# Patient Record
Sex: Female | Born: 1935 | Race: White | Hispanic: No | Smoking: Former smoker
Health system: Southern US, Community
[De-identification: ages and names within clinical notes are randomized; demographics above are authoritative.]

## PROBLEM LIST (undated history)

## (undated) DIAGNOSIS — R2240 Localized swelling, mass and lump, unspecified lower limb: Secondary | ICD-10-CM

## (undated) DIAGNOSIS — E039 Hypothyroidism, unspecified: Secondary | ICD-10-CM

## (undated) DIAGNOSIS — C801 Malignant (primary) neoplasm, unspecified: Secondary | ICD-10-CM

## (undated) DIAGNOSIS — M810 Age-related osteoporosis without current pathological fracture: Secondary | ICD-10-CM

## (undated) HISTORY — PX: EYE SURGERY: SHX253

## (undated) HISTORY — PX: OTHER SURGICAL HISTORY: SHX169

## (undated) HISTORY — DX: Age-related osteoporosis without current pathological fracture: M81.0

## (undated) HISTORY — PX: TONSILLECTOMY: SUR1361

## (undated) HISTORY — DX: Malignant (primary) neoplasm, unspecified: C80.1

---

## 2005-02-16 DIAGNOSIS — C801 Malignant (primary) neoplasm, unspecified: Secondary | ICD-10-CM

## 2005-02-16 HISTORY — PX: BREAST SURGERY: SHX581

## 2005-02-16 HISTORY — PX: PORT A CATH REVISION: SHX6033

## 2005-02-16 HISTORY — DX: Malignant (primary) neoplasm, unspecified: C80.1

## 2009-02-16 HISTORY — PX: TOTAL HIP ARTHROPLASTY: SHX124

## 2012-09-28 ENCOUNTER — Other Ambulatory Visit: Payer: Self-pay | Admitting: Internal Medicine

## 2012-10-14 ENCOUNTER — Ambulatory Visit
Admission: RE | Admit: 2012-10-14 | Discharge: 2012-10-14 | Disposition: A | Discharge status: Home / Self Care | Payer: Medicare Other | Source: Ambulatory Visit | Attending: Internal Medicine | Admitting: Internal Medicine

## 2013-01-11 ENCOUNTER — Encounter (INDEPENDENT_AMBULATORY_CARE_PROVIDER_SITE_OTHER): Payer: Self-pay | Admitting: Surgery

## 2013-01-11 ENCOUNTER — Encounter (INDEPENDENT_AMBULATORY_CARE_PROVIDER_SITE_OTHER): Payer: Self-pay

## 2013-01-11 ENCOUNTER — Ambulatory Visit (INDEPENDENT_AMBULATORY_CARE_PROVIDER_SITE_OTHER): Payer: Medicare Other | Admitting: Surgery

## 2013-01-11 VITALS — BP 142/78 | HR 86 | Resp 16 | Ht 61.5 in | Wt 114.4 lb

## 2013-01-11 DIAGNOSIS — D449 Neoplasm of uncertain behavior of unspecified endocrine gland: Secondary | ICD-10-CM

## 2013-01-11 DIAGNOSIS — D440 Neoplasm of uncertain behavior of thyroid gland: Secondary | ICD-10-CM

## 2013-01-11 DIAGNOSIS — E065 Other chronic thyroiditis: Secondary | ICD-10-CM | POA: Insufficient documentation

## 2013-01-11 NOTE — Progress Notes (Signed)
General Surgery [facility]  Chief Complaint  Patient presents with  . New Evaluation    Hurthle cell neoplasm of thyroid - referral from Dr. Allena Napoleon    HISTORY: Patient is an 79 year old female referred by her primary care physician for evaluation of newly diagnosed thyroid nodule with cytologic atypia. Patient had undergone a carotid duplex scan in August 2014. An incidental finding of right thyroid nodule was made. Patient subsequently underwent a ultrasound guided fine needle aspiration biopsy. 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  The nodule measured 1.6 cm in size. Surgical consultation is now recommended.  Patient has no prior history of thyroid disease. She was recently diagnosed with thyroiditis and hypothyroidism. She has been started on a thyroid hormone supplement. There is family history of thyroid disease in multiple family members. The patient's daughter does take thyroid hormone. The patient has a sister who underwent thyroidectomy. There is no family history of thyroid malignancy. There is no family history of other endocrine neoplasms.  Patient has had no prior head or neck surgery with the exception of tonsillectomy.  Past Medical History  Diagnosis Date  . Osteoporosis   . Cancer     Current Outpatient Prescriptions  Medication Sig Dispense Refill  . Sodium Borate (BORAX) GRAN by Does not apply route. Patient takes 1 tsp of diluted borax a day       No current facility-administered medications for this visit.    No Known Allergies  Family History  Problem Relation Age of Onset  . Cancer Daughter     breast  . Cancer Maternal Aunt     breast    History   Social History  . Marital Status: Divorced    Spouse Name: N/A    Number of Children: N/A  . Years of Education: N/A   Social History Main Topics  . Smoking status: Never Smoker   . Smokeless tobacco: Never Used  . Alcohol  Use: No  . Drug Use: No  . Sexual Activity: None   Other Topics Concern  . None   Social History Narrative  . None    REVIEW OF SYSTEMS - PERTINENT POSITIVES ONLY: Denies tremor. Denies palpitations. Denies compressive symptoms.  EXAM: Filed Vitals:   01/11/13 1006  BP: 142/78  Pulse: 86  Resp: 16    HEENT: normocephalic; pupils equal and reactive; sclerae clear; dentition good; mucous membranes moist NECK:  No palpable dominant nodule within the thyroid bed; symmetric on extension; no palpable anterior or posterior cervical lymphadenopathy; no supraclavicular masses; no tenderness CHEST: clear to auscultation bilaterally without rales, rhonchi, or wheezes CARDIAC: regular rate and rhythm without significant murmur; peripheral pulses are full EXT:  non-tender without edema; no deformity NEURO: no gross focal deficits; no sign of tremor   LABORATORY RESULTS: See [facility] (CHL-Epic) for most recent results  RADIOLOGY RESULTS: See [facility] (CHL-Epic) for most recent results  IMPRESSION: Right thyroid nodule, 1.6 cm, with Hrthle cell changes   PLAN: I had a lengthy discussion with the patient, her daughter, and her grandson. I provided him with written literature to review at home. We answered many questions.  We reviewed her diagnostic studies as well as her biopsy report. I provided him a copy of the biopsy report for review at home.  I have recommended right thyroid lobectomy for definitive diagnosis. We will examine the left thyroid lobe intraoperatively and performed total thyroidectomy if there are significant findings.  Otherwise thyroid lobectomy should be sufficient. If the final pathology shows malignancy however, she would then require a completion thyroidectomy. We discussed risk of malignancy being on the order of 15-20%. We discussed subsequent treatment for malignancy including radioactive iodine administration.  Risk and benefits of the  procedure are discussed at length including risk of injury to parathyroid glands and to recurrent laryngeal nerves. We discussed the hospital stay to be anticipated. We discussed her subsequent recovery. We discussed the likelihood of lifetime thyroid hormone replacement therapy. Patient and her family understand and wish to proceed.  The risks and benefits of the procedure have been discussed at length with the patient.  The patient understands the proposed procedure, potential alternative treatments, and the course of recovery to be expected.  All of the patient's questions have been answered at this time.  The patient wishes to proceed with surgery.   Velora Heckler, MD, FACS General & Endocrine Surgery [facility]  Primary Care Physician: Mia Creek, MD

## 2013-01-11 NOTE — Patient Instructions (Signed)

## 2013-01-24 ENCOUNTER — Encounter (INDEPENDENT_AMBULATORY_CARE_PROVIDER_SITE_OTHER): Payer: Self-pay

## 2013-02-03 ENCOUNTER — Other Ambulatory Visit (HOSPITAL_COMMUNITY): Payer: Self-pay | Admitting: *Deleted

## 2013-02-06 ENCOUNTER — Encounter (HOSPITAL_COMMUNITY): Payer: Self-pay | Admitting: Pharmacy Technician

## 2013-02-06 ENCOUNTER — Encounter (HOSPITAL_COMMUNITY)
Admission: RE | Admit: 2013-02-06 | Discharge: 2013-02-06 | Disposition: A | Discharge status: Home / Self Care | Payer: Medicare Other | Source: Ambulatory Visit | Attending: Surgery | Admitting: Surgery

## 2013-02-06 ENCOUNTER — Ambulatory Visit (HOSPITAL_COMMUNITY)
Admission: RE | Admit: 2013-02-06 | Discharge: 2013-02-06 | Disposition: A | Discharge status: Home / Self Care | Payer: Medicare Other | Source: Ambulatory Visit | Attending: Surgery | Admitting: Surgery

## 2013-02-06 ENCOUNTER — Encounter (HOSPITAL_COMMUNITY): Payer: Self-pay

## 2013-02-06 DIAGNOSIS — J984 Other disorders of lung: Secondary | ICD-10-CM | POA: Insufficient documentation

## 2013-02-06 DIAGNOSIS — Z01812 Encounter for preprocedural laboratory examination: Secondary | ICD-10-CM | POA: Insufficient documentation

## 2013-02-06 DIAGNOSIS — Z01818 Encounter for other preprocedural examination: Secondary | ICD-10-CM | POA: Insufficient documentation

## 2013-02-06 DIAGNOSIS — D449 Neoplasm of uncertain behavior of unspecified endocrine gland: Secondary | ICD-10-CM | POA: Insufficient documentation

## 2013-02-06 HISTORY — DX: Hypothyroidism, unspecified: E03.9

## 2013-02-06 HISTORY — DX: Localized swelling, mass and lump, unspecified lower limb: R22.40

## 2013-02-06 LAB — CBC
HCT: 39.6 % (ref 36.0–46.0)
Hemoglobin: 13.6 g/dL (ref 12.0–15.0)
MCH: 31.6 pg (ref 26.0–34.0)
MCHC: 34.3 g/dL (ref 30.0–36.0)
Platelets: 203 10*3/uL (ref 150–400)
RBC: 4.31 MIL/uL (ref 3.87–5.11)
WBC: 6.7 10*3/uL (ref 4.0–10.5)

## 2013-02-06 NOTE — Progress Notes (Signed)
US carotid duplex 09-28-12 epic

## 2013-02-06 NOTE — Patient Instructions (Addendum)
20 Heather Payne  02/06/2013   Your procedure is scheduled on: Friday January 2, 105  Report to [facility] at 530  AM.  Call this number if you have problems the morning of surgery [phone]   Remember:   Do not eat food or drink liquids :After Midnight.     Take these medicines the morning of surgery with A SIP OF WATER: LEVOTHRYOXINE (THRYOID PILL)                                SEE [facility] PREPARING FOR SURGERY SHEET             You may not have any metal on your body including hair pins and piercings  Do not wear jewelry, make-up.  Do not wear lotions, powders, or perfumes. You may wear deodorant.   Men may shave face and neck.  Do not bring valuables to the hospital. [facility] IS NOT RESPONSIBLE FOR VALUEABLES.  Contacts, dentures or bridgework may not be worn into surgery.  Leave suitcase in the car. After surgery it may be brought to your room.  For patients admitted to the hospital, checkout time is 11:00 AM the day of discharge.   Patients discharged the day of surgery will not be allowed to drive home.  Name and phone number of your driver:  Special Instructions: N/A   Please read over the following fact sheets that you were given:   Call Cain Sieve RN pre op nurse if needed [phone]    FAILURE TO FOLLOW THESE INSTRUCTIONS MAY RESULT IN THE CANCELLATION OF YOUR SURGERY.  PATIENT SIGNATURE___________________________________________  NURSE SIGNATURE_____________________________________________

## 2013-02-17 ENCOUNTER — Ambulatory Visit (HOSPITAL_COMMUNITY): Payer: Medicare Other | Admitting: Anesthesiology

## 2013-02-17 ENCOUNTER — Encounter (HOSPITAL_COMMUNITY)
Admission: RE | Disposition: A | Discharge status: Home / Self Care | Payer: Self-pay | Source: Ambulatory Visit | Attending: Surgery

## 2013-02-17 ENCOUNTER — Encounter (HOSPITAL_COMMUNITY): Payer: Self-pay | Admitting: *Deleted

## 2013-02-17 ENCOUNTER — Observation Stay (HOSPITAL_COMMUNITY)
Admission: RE | Admit: 2013-02-17 | Discharge: 2013-02-18 | Disposition: A | Discharge status: Home / Self Care | Payer: Medicare Other | Source: Ambulatory Visit | Attending: Surgery | Admitting: Surgery

## 2013-02-17 ENCOUNTER — Encounter (HOSPITAL_COMMUNITY): Payer: Medicare Other | Admitting: Anesthesiology

## 2013-02-17 DIAGNOSIS — D34 Benign neoplasm of thyroid gland: Secondary | ICD-10-CM

## 2013-02-17 DIAGNOSIS — Z79899 Other long term (current) drug therapy: Secondary | ICD-10-CM | POA: Insufficient documentation

## 2013-02-17 DIAGNOSIS — IMO0002 Reserved for concepts with insufficient information to code with codable children: Secondary | ICD-10-CM | POA: Diagnosis present

## 2013-02-17 DIAGNOSIS — D449 Neoplasm of uncertain behavior of unspecified endocrine gland: Secondary | ICD-10-CM | POA: Insufficient documentation

## 2013-02-17 DIAGNOSIS — M810 Age-related osteoporosis without current pathological fracture: Secondary | ICD-10-CM | POA: Insufficient documentation

## 2013-02-17 DIAGNOSIS — Z96649 Presence of unspecified artificial hip joint: Secondary | ICD-10-CM | POA: Insufficient documentation

## 2013-02-17 DIAGNOSIS — D440 Neoplasm of uncertain behavior of thyroid gland: Secondary | ICD-10-CM | POA: Diagnosis present

## 2013-02-17 DIAGNOSIS — Z87891 Personal history of nicotine dependence: Secondary | ICD-10-CM | POA: Insufficient documentation

## 2013-02-17 DIAGNOSIS — Z853 Personal history of malignant neoplasm of breast: Secondary | ICD-10-CM | POA: Insufficient documentation

## 2013-02-17 DIAGNOSIS — E065 Other chronic thyroiditis: Secondary | ICD-10-CM | POA: Diagnosis present

## 2013-02-17 DIAGNOSIS — E063 Autoimmune thyroiditis: Secondary | ICD-10-CM | POA: Insufficient documentation

## 2013-02-17 DIAGNOSIS — E041 Nontoxic single thyroid nodule: Principal | ICD-10-CM | POA: Insufficient documentation

## 2013-02-17 DIAGNOSIS — E039 Hypothyroidism, unspecified: Secondary | ICD-10-CM | POA: Insufficient documentation

## 2013-02-17 HISTORY — PX: THYROID LOBECTOMY: SHX420

## 2013-02-17 SURGERY — LOBECTOMY, THYROID
Anesthesia: General | Site: Neck | Laterality: Right

## 2013-02-17 MED ORDER — CEFAZOLIN SODIUM-DEXTROSE 2-3 GM-% IV SOLR
2.0000 g | INTRAVENOUS | Status: AC
  Administered 2013-02-17: 2 g via INTRAVENOUS

## 2013-02-17 MED ORDER — INFLUENZA VAC SPLIT QUAD 0.5 ML IM SUSP
0.5000 mL | INTRAMUSCULAR | Status: DC
  Filled 2013-02-17 (×2): qty 0.5

## 2013-02-17 MED ORDER — NEOSTIGMINE METHYLSULFATE 1 MG/ML IJ SOLN
INTRAMUSCULAR | Status: DC | PRN
  Administered 2013-02-17: 3 mg via INTRAVENOUS

## 2013-02-17 MED ORDER — LACTATED RINGERS IV SOLN: INTRAVENOUS | Status: DC

## 2013-02-17 MED ORDER — GLYCOPYRROLATE 0.2 MG/ML IJ SOLN
INTRAMUSCULAR | Status: DC | PRN
  Administered 2013-02-17: 0.4 mg via INTRAVENOUS

## 2013-02-17 MED ORDER — FENTANYL CITRATE 0.05 MG/ML IJ SOLN
INTRAMUSCULAR | Status: AC
  Filled 2013-02-17: qty 2

## 2013-02-17 MED ORDER — GLYCOPYRROLATE 0.2 MG/ML IJ SOLN
INTRAMUSCULAR | Status: AC
  Filled 2013-02-17: qty 2

## 2013-02-17 MED ORDER — LIDOCAINE HCL (CARDIAC) 20 MG/ML IV SOLN
INTRAVENOUS | Status: AC
  Filled 2013-02-17: qty 5

## 2013-02-17 MED ORDER — ONDANSETRON HCL 4 MG/2ML IJ SOLN
INTRAMUSCULAR | Status: DC | PRN
  Administered 2013-02-17: 4 mg via INTRAVENOUS

## 2013-02-17 MED ORDER — DEXAMETHASONE SODIUM PHOSPHATE 10 MG/ML IJ SOLN
INTRAMUSCULAR | Status: AC
  Filled 2013-02-17: qty 1

## 2013-02-17 MED ORDER — NEOSTIGMINE METHYLSULFATE 1 MG/ML IJ SOLN
INTRAMUSCULAR | Status: AC
  Filled 2013-02-17: qty 10

## 2013-02-17 MED ORDER — ONDANSETRON HCL 4 MG/2ML IJ SOLN: 4.0000 mg | Freq: Four times a day (QID) | INTRAMUSCULAR | Status: DC | PRN

## 2013-02-17 MED ORDER — ACETAMINOPHEN 325 MG PO TABS: 650.0000 mg | ORAL_TABLET | ORAL | Status: DC | PRN

## 2013-02-17 MED ORDER — EPHEDRINE SULFATE 50 MG/ML IJ SOLN
INTRAMUSCULAR | Status: AC
  Filled 2013-02-17: qty 1

## 2013-02-17 MED ORDER — 0.9 % SODIUM CHLORIDE (POUR BTL) OPTIME
TOPICAL | Status: DC | PRN
  Administered 2013-02-17: 1000 mL

## 2013-02-17 MED ORDER — HYDROMORPHONE HCL PF 1 MG/ML IJ SOLN: 0.5000 mg | INTRAMUSCULAR | Status: DC | PRN

## 2013-02-17 MED ORDER — LIDOCAINE HCL (CARDIAC) 20 MG/ML IV SOLN
INTRAVENOUS | Status: DC | PRN
  Administered 2013-02-17: 50 mg via INTRAVENOUS

## 2013-02-17 MED ORDER — PROPOFOL 10 MG/ML IV BOLUS
INTRAVENOUS | Status: AC
  Filled 2013-02-17: qty 20

## 2013-02-17 MED ORDER — PROMETHAZINE HCL 25 MG/ML IJ SOLN: 6.2500 mg | INTRAMUSCULAR | Status: DC | PRN

## 2013-02-17 MED ORDER — KCL IN DEXTROSE-NACL 20-5-0.45 MEQ/L-%-% IV SOLN
INTRAVENOUS | Status: DC
  Administered 2013-02-17: 13:00:00 via INTRAVENOUS
  Filled 2013-02-17 (×2): qty 1000

## 2013-02-17 MED ORDER — ROCURONIUM BROMIDE 100 MG/10ML IV SOLN
INTRAVENOUS | Status: DC | PRN
  Administered 2013-02-17: 50 mg via INTRAVENOUS

## 2013-02-17 MED ORDER — HYDROCODONE-ACETAMINOPHEN 5-325 MG PO TABS: 1.0000 | ORAL_TABLET | ORAL | Status: DC | PRN

## 2013-02-17 MED ORDER — ROCURONIUM BROMIDE 100 MG/10ML IV SOLN
INTRAVENOUS | Status: AC
  Filled 2013-02-17: qty 1

## 2013-02-17 MED ORDER — LACTATED RINGERS IV SOLN
INTRAVENOUS | Status: DC | PRN
  Administered 2013-02-17: 07:00:00 via INTRAVENOUS

## 2013-02-17 MED ORDER — ONDANSETRON HCL 4 MG PO TABS: 4.0000 mg | ORAL_TABLET | Freq: Four times a day (QID) | ORAL | Status: DC | PRN

## 2013-02-17 MED ORDER — PROPOFOL 10 MG/ML IV BOLUS
INTRAVENOUS | Status: DC | PRN
  Administered 2013-02-17: 110 mg via INTRAVENOUS

## 2013-02-17 MED ORDER — ONDANSETRON HCL 4 MG/2ML IJ SOLN
INTRAMUSCULAR | Status: AC
  Filled 2013-02-17: qty 2

## 2013-02-17 MED ORDER — DEXAMETHASONE SODIUM PHOSPHATE 10 MG/ML IJ SOLN
INTRAMUSCULAR | Status: DC | PRN
  Administered 2013-02-17: 5 mg via INTRAVENOUS

## 2013-02-17 MED ORDER — HYDROMORPHONE HCL PF 1 MG/ML IJ SOLN
INTRAMUSCULAR | Status: AC
  Filled 2013-02-17: qty 1

## 2013-02-17 MED ORDER — ATROPINE SULFATE 0.4 MG/ML IJ SOLN
INTRAMUSCULAR | Status: AC
  Filled 2013-02-17: qty 1

## 2013-02-17 MED ORDER — LEVOTHYROXINE SODIUM 100 MCG PO TABS
100.0000 ug | ORAL_TABLET | Freq: Every day | ORAL | Status: DC
  Administered 2013-02-18: 100 ug via ORAL
  Filled 2013-02-17 (×2): qty 1

## 2013-02-17 MED ORDER — HYDROMORPHONE HCL PF 1 MG/ML IJ SOLN
0.2500 mg | INTRAMUSCULAR | Status: DC | PRN
  Administered 2013-02-17 (×2): 0.5 mg via INTRAVENOUS

## 2013-02-17 MED ORDER — CEFAZOLIN SODIUM-DEXTROSE 2-3 GM-% IV SOLR
INTRAVENOUS | Status: AC
  Filled 2013-02-17: qty 50

## 2013-02-17 MED ORDER — FENTANYL CITRATE 0.05 MG/ML IJ SOLN
INTRAMUSCULAR | Status: DC | PRN
Start: 2013-02-17 — End: 2013-02-17
  Administered 2013-02-17: 25 ug via INTRAVENOUS
  Administered 2013-02-17 (×3): 50 ug via INTRAVENOUS
  Administered 2013-02-17: 25 ug via INTRAVENOUS

## 2013-02-17 MED ORDER — SODIUM CHLORIDE 0.9 % IJ SOLN
INTRAMUSCULAR | Status: AC
  Filled 2013-02-17: qty 10

## 2013-02-17 SURGICAL SUPPLY — 38 items
ATTRACTOMAT 16X20 MAGNETIC DRP (DRAPES) ×3 IMPLANT
BENZOIN TINCTURE PRP APPL 2/3 (GAUZE/BANDAGES/DRESSINGS) ×3 IMPLANT
BLADE HEX COATED 2.75 (ELECTRODE) ×3 IMPLANT
BLADE SURG 15 STRL LF DISP TIS (BLADE) ×1 IMPLANT
BLADE SURG 15 STRL SS (BLADE) ×2
CANISTER SUCTION 2500CC (MISCELLANEOUS) ×3 IMPLANT
CHLORAPREP W/TINT 10.5 ML (MISCELLANEOUS) ×3 IMPLANT
CLIP TI MEDIUM 6 (CLIP) ×6 IMPLANT
CLIP TI WIDE RED SMALL 6 (CLIP) ×9 IMPLANT
CLOSURE WOUND 1/2 X4 (GAUZE/BANDAGES/DRESSINGS) ×1
DISSECTOR ROUND CHERRY 3/8 STR (MISCELLANEOUS) IMPLANT
DRAPE PED LAPAROTOMY (DRAPES) ×3 IMPLANT
DRESSING SURGICEL FIBRLLR 1X2 (HEMOSTASIS) ×1 IMPLANT
DRSG SURGICEL FIBRILLAR 1X2 (HEMOSTASIS) ×3
ELECT REM PT RETURN 9FT ADLT (ELECTROSURGICAL) ×3
ELECTRODE REM PT RTRN 9FT ADLT (ELECTROSURGICAL) ×1 IMPLANT
GAUZE SPONGE 4X4 16PLY XRAY LF (GAUZE/BANDAGES/DRESSINGS) ×3 IMPLANT
GLOVE SURG ORTHO 8.0 STRL STRW (GLOVE) ×3 IMPLANT
GOWN PREVENTION PLUS LG XLONG (DISPOSABLE) ×3 IMPLANT
GOWN STRL REIN XL XLG (GOWN DISPOSABLE) ×6 IMPLANT
KIT BASIN OR (CUSTOM PROCEDURE TRAY) ×3 IMPLANT
NS IRRIG 1000ML POUR BTL (IV SOLUTION) ×3 IMPLANT
PACK BASIC VI WITH GOWN DISP (CUSTOM PROCEDURE TRAY) ×3 IMPLANT
PENCIL BUTTON HOLSTER BLD 10FT (ELECTRODE) ×3 IMPLANT
SHEARS HARMONIC 9CM CVD (BLADE) ×3 IMPLANT
SPONGE GAUZE 4X4 12PLY (GAUZE/BANDAGES/DRESSINGS) ×3 IMPLANT
STAPLER VISISTAT 35W (STAPLE) ×3 IMPLANT
STRIP CLOSURE SKIN 1/2X4 (GAUZE/BANDAGES/DRESSINGS) ×2 IMPLANT
SUT MNCRL AB 4-0 PS2 18 (SUTURE) ×6 IMPLANT
SUT SILK 2 0 (SUTURE)
SUT SILK 2-0 18XBRD TIE 12 (SUTURE) IMPLANT
SUT SILK 3 0 (SUTURE)
SUT SILK 3-0 18XBRD TIE 12 (SUTURE) IMPLANT
SUT VIC AB 3-0 SH 18 (SUTURE) ×3 IMPLANT
SYR BULB IRRIGATION 50ML (SYRINGE) ×3 IMPLANT
TAPE CLOTH SURG 4X10 WHT LF (GAUZE/BANDAGES/DRESSINGS) ×3 IMPLANT
TOWEL OR 17X26 10 PK STRL BLUE (TOWEL DISPOSABLE) ×3 IMPLANT
YANKAUER SUCT BULB TIP 10FT TU (MISCELLANEOUS) ×3 IMPLANT

## 2013-02-17 NOTE — Anesthesia Preprocedure Evaluation (Signed)
Anesthesia Evaluation  Patient identified by MRN, date of birth, ID band Patient awake    Reviewed: Allergy & Precautions, H&P , NPO status , Patient's Chart, lab work & pertinent test results  Airway Mallampati: II TM Distance: >3 FB Neck ROM: Full    Dental no notable dental hx.    Pulmonary neg pulmonary ROS, former smoker,  breath sounds clear to auscultation  Pulmonary exam normal       Cardiovascular negative cardio ROS  Rhythm:Regular Rate:Normal     Neuro/Psych negative neurological ROS  negative psych ROS   GI/Hepatic negative GI ROS, Neg liver ROS,   Endo/Other  Hypothyroidism   Renal/GU negative Renal ROS  negative genitourinary   Musculoskeletal negative musculoskeletal ROS (+)   Abdominal   Peds negative pediatric ROS (+)  Hematology negative hematology ROS (+)   Anesthesia Other Findings   Reproductive/Obstetrics negative OB ROS                           Anesthesia Physical Anesthesia Plan  ASA: II  Anesthesia Plan: General   Post-op Pain Management:    Induction: Intravenous  Airway Management Planned: Oral ETT  Additional Equipment:   Intra-op Plan:   Post-operative Plan: Extubation in OR  Informed Consent: I have reviewed the patients History and Physical, chart, labs and discussed the procedure including the risks, benefits and alternatives for the proposed anesthesia with the patient or authorized representative who has indicated his/her understanding and acceptance.   Dental advisory given  Plan Discussed with: CRNA and Surgeon  Anesthesia Plan Comments:         Anesthesia Quick Evaluation

## 2013-02-17 NOTE — Transfer of Care (Signed)
Immediate Anesthesia Transfer of Care Note  Patient: Heather Payne  Procedure(s) Performed: Procedure(s): RIGHT THYROID LOBECTOMY (Right)  Patient Location: PACU  Anesthesia Type:General  Level of Consciousness: awake, sedated and responds to stimulation  Airway & Oxygen Therapy: Patient Spontanous Breathing and Patient connected to face mask oxygen  Post-op Assessment: Report given to PACU RN and Post -op Vital signs reviewed and stable  Post vital signs: Reviewed and stable  Complications: No apparent anesthesia complications

## 2013-02-17 NOTE — Discharge Instructions (Signed)

## 2013-02-17 NOTE — Anesthesia Postprocedure Evaluation (Signed)
  Anesthesia Post-op Note  Patient: Heather Payne  Procedure(s) Performed: Procedure(s) (LRB): RIGHT THYROID LOBECTOMY (Right)  Patient Location: PACU  Anesthesia Type: General  Level of Consciousness: awake and alert   Airway and Oxygen Therapy: Patient Spontanous Breathing  Post-op Pain: mild  Post-op Assessment: Post-op Vital signs reviewed, Patient's Cardiovascular Status Stable, Respiratory Function Stable, Patent Airway and No signs of Nausea or vomiting  Last Vitals:  Filed Vitals:   02/17/13 1430  BP: 150/72  Pulse: 75  Temp: 36.5 C  Resp: 18    Post-op Vital Signs: stable   Complications: No apparent anesthesia complications

## 2013-02-17 NOTE — Preoperative (Signed)
Beta Blockers   Reason not to administer Beta Blockers:Not Applicable 

## 2013-02-17 NOTE — Brief Op Note (Signed)
02/17/2013  10:14 AM  PATIENT:  Heather Payne  80 y.o. female  PRE-OPERATIVE DIAGNOSIS:  right thyroid nodule with Hurthle cell changes  POST-OPERATIVE DIAGNOSIS:  right thyroid nodule with Hurthle cell changes  PROCEDURE:  Procedure(s): RIGHT THYROID LOBECTOMY (Right)  SURGEON:  Surgeon(s) and Role:    * Earnstine Regal, MD - Primary  ANESTHESIA:   general  EBL:     BLOOD ADMINISTERED:none  DRAINS: none   LOCAL MEDICATIONS USED:  NONE  SPECIMEN:  Excision  DISPOSITION OF SPECIMEN:  PATHOLOGY  COUNTS:  YES  TOURNIQUET:  * No tourniquets in log *  DICTATION: .Other Dictation: Dictation Number [id number]  PLAN OF CARE: Admit for overnight observation  PATIENT DISPOSITION:  PACU - hemodynamically stable.   Delay start of Pharmacological VTE agent (>24hrs) due to surgical blood loss or risk of bleeding: yes  Earnstine Regal, MD, [facility] Office: [phone]

## 2013-02-17 NOTE — H&P (Signed)
Heather Payne is an 80 y.o. female.    Chief Complaint: right thyroid nodule with Hurthle cell change  HPI: Patient is an 80 year old female referred by her primary care physician for evaluation of newly diagnosed thyroid nodule with cytologic atypia. Patient had undergone a carotid duplex scan in August 2014. An incidental finding of right thyroid nodule was made. Patient subsequently underwent a ultrasound guided fine needle aspiration biopsy. 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 The nodule measured 1.6 cm in size. Patient now comes to surgery for resection for definitive diagnosis.  Past Medical History  Diagnosis Date  . Osteoporosis   . Hypothyroidism   . Cancer 2007    LEFT BREAST, CHEMO AND RADIATION DONE  . Localized swelling, mass, or lump of lower extremity     FOR LAST WEEK AFTER SITTING LONG TIME FOR TRAVEL    Past Surgical History  Procedure Laterality Date  . Total hip arthroplasty Right 2011  . Breast surgery Left 2007    LYMPH NODES REMOVED  . Tonsillectomy  AGE 44  . Port a cath revision  2007    LATER REMOVED, LUNG COLLASPED AFTER PORT INSERTION  . Dental implants  YRS AGO     X 3  . Eye surgery Bilateral     CATARACT SURGERY    Family History  Problem Relation Age of Onset  . Cancer Daughter     breast  . Cancer Maternal Aunt     breast   Social History:  reports that she quit smoking about 51 years ago. Her smoking use included Cigarettes. She has a .5 pack-year smoking history. She has never used smokeless tobacco. She reports that she drinks alcohol. She reports that she does not use illicit drugs.  Allergies: No Known Allergies  Medications Prior to Admission  Medication Sig Dispense Refill  . Cholecalciferol (VITAMIN D PO) Take 1 tablet by mouth 2 (two) times daily.      . IODINE, KELP, PO Take 1 tablet by mouth daily.      Marland Kitchen levothyroxine (SYNTHROID, LEVOTHROID) 50 MCG tablet Take 100 mcg by mouth  daily before breakfast.      . MAGNESIUM PO Take 1 tablet by mouth 2 (two) times daily.      . Multiple Vitamins-Minerals (OCUVITE PRESERVISION PO) Take 1 tablet by mouth 2 (two) times daily.      . Nutritional Supplements (DHEA PO) Take 2 mg by mouth 2 (two) times daily.      . Omega-3 Fatty Acids (OMEGA 3 PO) Take 1 tablet by mouth daily.      Marland Kitchen OVER THE COUNTER MEDICATION Take 2.5 mLs by mouth daily.      Marland Kitchen OVER THE COUNTER MEDICATION Take 1 tablet by mouth 2 (two) times daily.      Marland Kitchen Specialty Vitamins Products (BRAIN PO) Take 1 tablet by mouth 2 (two) times daily.      . Sodium Borate (BORAX) GRAN by Does not apply route. Patient takes 1 tsp of diluted borax a day        No results found for this or any previous visit (from the past 48 hour(s)). No results found.  Review of Systems  Constitutional: Negative.   HENT: Negative.   Eyes: Negative.   Respiratory: Negative.   Cardiovascular: Negative.   Gastrointestinal: Negative.   Genitourinary: Negative.   Musculoskeletal: Negative.   Skin: Negative.   Neurological: Negative.   Endo/Heme/Allergies: Negative.   Psychiatric/Behavioral: Negative.  Blood pressure 158/90, pulse 73, temperature 97.5 F (36.4 C), temperature source Oral, resp. rate 16, SpO2 100.00%. Physical Exam  Constitutional: She is oriented to person, place, and time. She appears well-developed and well-nourished. No distress.  HENT:  Head: Normocephalic and atraumatic.  Right Ear: External ear normal.  Left Ear: External ear normal.  Eyes: Conjunctivae are normal. Pupils are equal, round, and reactive to light. No scleral icterus.  Neck: Normal range of motion. Neck supple. No tracheal deviation present. No thyromegaly present.  Cardiovascular: Normal rate, regular rhythm and normal heart sounds.   Respiratory: Effort normal and breath sounds normal. She has no wheezes.  GI: Soft. Bowel sounds are normal. She exhibits no distension.  Musculoskeletal:  Normal range of motion. She exhibits no edema.  Lymphadenopathy:    She has no cervical adenopathy.  Neurological: She is alert and oriented to person, place, and time.  Skin: Skin is warm and dry.  Psychiatric: She has a normal mood and affect. Her behavior is normal.     Assessment/Plan Right thyroid nodule with Hurthle cell change  Plan right thyroid lobectomy  The risks and benefits of the procedure have been discussed at length with the patient.  The patient understands the proposed procedure, potential alternative treatments, and the course of recovery to be expected.  All of the patient's questions have been answered at this time.  The patient wishes to proceed with surgery.  Earnstine Regal, MD, [facility] Office: [place] 02/17/2013, 8:24 AM

## 2013-02-18 MED ORDER — HYDROCODONE-ACETAMINOPHEN 5-325 MG PO TABS: 1.0000 | ORAL_TABLET | ORAL | Status: DC | PRN

## 2013-02-18 NOTE — Discharge Summary (Signed)
Physician Discharge Summary  Patient ID: Heather Payne MRN: [id number] DOB/AGE: 1928-08-07 80 y.o.  Admit date: 02/17/2013 Discharge date: 02/18/2013  Admission Diagnoses:  Discharge Diagnoses:  Principal Problem:   Neoplasm of uncertain behavior of thyroid gland, right lobe Active Problems:   Thyroiditis, chronic   Neoplasm of uncertain behavior, thyroid/parathyroid   Discharged Condition: good  Hospital Course: uneventful post op recovery  Consults: None  Significant Diagnostic Studies:   Treatments: surgery: right thyroid lobectomy  Discharge Exam: Blood pressure 128/75, pulse 70, temperature 97.2 F (36.2 C), temperature source Oral, resp. rate 18, height 5' (1.524 m), weight 113 lb 6.4 oz (51.438 kg), SpO2 96.00%. General appearance: alert, cooperative and no distress Neck: no adenopathy, no carotid bruit, no JVD, supple, symmetrical, trachea midline, thyroid not enlarged, symmetric, no tenderness/mass/nodules and no swelling or hemotoma  Disposition: Final discharge disposition not confirmed   Future Appointments Provider Department Dept Phone   02/27/2013 9:30 AM Earnstine Regal, MD [facility], [place] [phone]       Medication List    ASK your doctor about these medications       Dola Argyle  by Does not apply route. Patient takes 1 tsp of diluted borax a day     BRAIN PO  Take 1 tablet by mouth 2 (two) times daily.     DHEA PO  Take 2 mg by mouth 2 (two) times daily.     IODINE (KELP) PO  Take 1 tablet by mouth daily.     levothyroxine 50 MCG tablet  Commonly known as:  SYNTHROID, LEVOTHROID  Take 100 mcg by mouth daily before breakfast.     MAGNESIUM PO  Take 1 tablet by mouth 2 (two) times daily.     OCUVITE PRESERVISION PO  Take 1 tablet by mouth 2 (two) times daily.     OMEGA 3 PO  Take 1 tablet by mouth daily.     OVER THE COUNTER MEDICATION  Take 2.5 mLs by mouth daily.     OVER THE COUNTER MEDICATION  Take 1 tablet by  mouth 2 (two) times daily.     VITAMIN D PO  Take 1 tablet by mouth 2 (two) times daily.           Follow-up Information   Follow up with Earnstine Regal, MD. Schedule an appointment as soon as possible for a visit in 3 weeks.   Specialty:  General Surgery   Contact information:   [address] [place] [address] [phone]       Signed: Harl Bowie 02/18/2013, 8:01 AM

## 2013-02-18 NOTE — Progress Notes (Signed)
1 Day Post-Op  Subjective: Doing well No complaints  Objective: Vital signs in last 24 hours: Temp:  [97.2 F (36.2 C)-98.2 F (36.8 C)] 97.2 F (36.2 C) (01/03 0600) Pulse Rate:  [55-93] 70 (01/03 0600) Resp:  [12-18] 18 (01/03 0600) BP: (123-169)/(69-92) 128/75 mmHg (01/03 0600) SpO2:  [96 %-100 %] 96 % (01/03 0600) Weight:  [113 lb 6.4 oz (51.438 kg)] 113 lb 6.4 oz (51.438 kg) (01/02 1137) Last BM Date: 02/16/13  Intake/Output from previous day: 01/02 0701 - 01/03 0700 In: 1741.7 [P.O.:120; I.V.:1621.7] Out: 750 [Urine:750] Intake/Output this shift:    Comfortable Neck with no swelling or hematoma Voice clear  Lab Results:  No results found for this basename: WBC, HGB, HCT, PLT,  in the last 72 hours BMET No results found for this basename: NA, K, CL, CO2, GLUCOSE, BUN, CREATININE, CALCIUM,  in the last 72 hours PT/INR No results found for this basename: LABPROT, INR,  in the last 72 hours ABG No results found for this basename: PHART, PCO2, PO2, HCO3,  in the last 72 hours  Studies/Results: No results found.  Anti-infectives: Anti-infectives   Start     Dose/Rate Route Frequency Ordered Stop   02/17/13 0511  ceFAZolin (ANCEF) IVPB 2 g/50 mL premix     2 g 100 mL/hr over 30 Minutes Intravenous On call to O.R. 02/17/13 0511 02/17/13 0910      Assessment/Plan: s/p Procedure(s): RIGHT THYROID LOBECTOMY (Right)  discharge  LOS: 1 day    Ayda Tancredi A 02/18/2013

## 2013-02-18 NOTE — Progress Notes (Signed)
Utilization Review completed.  

## 2013-02-18 NOTE — Op Note (Signed)
Heather Payne, Heather Payne              ACCOUNT NO.:  [id number]  MEDICAL RECORD NO.:  [id number]  LOCATION:  1536                         FACILITY:  [facility]  PHYSICIAN:  Earnstine Regal, MD      DATE OF BIRTH:  Jun 30, 1928  DATE OF PROCEDURE:  02/17/2013                                    OPERATIVE REPORT   PREOPERATIVE DIAGNOSIS:  Right thyroid nodule with Hurthle cell change.  POSTOPERATIVE DIAGNOSIS:  Right thyroid nodule with Hurthle cell change.  PROCEDURE:  Right thyroid lobectomy and isthmusectomy.  SURGEON:  Earnstine Regal, MD, FACS  ANESTHESIA:  General per Dr. Myrtie Soman.  ESTIMATED BLOOD LOSS:  Minimal.  PREPARATION:  ChloraPrep.  COMPLICATIONS:  None.  INDICATIONS:  The patient is an 80 year old female referred by Dr. Adriana Simas for evaluation of newly diagnosed thyroid nodule with cytologic atypia.  The patient had undergone a carotid duplex scan in August, 2014.  An incidental finding of right thyroid nodule was made. Fine needle aspiration biopsy showed a follicular neoplasm with Hurthle cell changes and diminished colloid.  The nodule measured 1.6 cm in size.  The patient now comes to surgery for resection for definitive diagnosis.  BODY OF REPORT:  Procedure was done in OR #1 at the [facility].  The patient was brought to the operating room, placed in a supine position on the operating room table.  Following administration of general anesthesia, the patient was positioned and then prepped and draped in the usual aseptic fashion.  After ascertaining that an adequate level of anesthesia had been achieved, a Kocher incision was made with a #15 blade.  Dissection was carried through subcutaneous tissues and platysma.  Hemostasis was achieved with electrocautery.  Skin flaps were elevated, cephalad and caudad from the thyroid notch to the sternal notch.  The Mahorner self-retaining retractor was placed for exposure.  Strap muscles were  incised in the midline.  Left thyroid lobe was exposed.  It was relatively firm.  There were no dominant or discrete masses.  There does appear to be approximately a 1-cm nodule in the isthmus just to the left of midline.  Next, the right thyroid lobe was approached.  Strap muscles were reflected laterally.  Right lobe was exposed.  Right lobe was slightly larger than the left.  It was gently immobilized.  Superior pole vessels were dissected out, divided individually between small and medium Ligaclips with the Harmonic scalpel.  Branches of the inferior thyroid artery were divided between small Ligaclips with the Harmonic scalpel. Recurrent laryngeal nerve was identified and preserved.  Parathyroid tissue was identified and preserved.  Gland was rolled anteriorly. Inferior venous tributaries were divided between Ligaclips with the Harmonic scalpel.  Ligament of Gwenlyn Found was released with the electrocautery and the gland was mobilized up and onto the anterior trachea.  Isthmus was mobilized across the midline.  The nodule in the isthmus was included with the resection.  The thyroid tissue was divided at the junction of the isthmus and left thyroid lobe using the Harmonic Scalpel for hemostasis.  Specimen was marked with a suture at the right superior pole.  The entire specimen was submitted to  Pathology for review.  Neck was irrigated with warm saline.  Good hemostasis was noted. Fibrillar was placed throughout the operative field.  Strap muscles were reapproximated in the midline with interrupted 3-0 Vicryl sutures. Platysma was closed with interrupted 3-0 Vicryl sutures.  Skin was closed with a running 4-0 Monocryl subcuticular suture.  Wound was washed and dried.  Benzoin Steri-Strips were applied.  Sterile dressings were applied.  The patient was awakened from anesthesia and brought to the recovery room.  The patient tolerated the procedure well.   Earnstine Regal, MD, [facility] Office: [phone]   TMG/MEDQ  D:  02/17/2013  T:  02/17/2013  Job:  [id number]  cc:   Adriana Simas Fax: [phone]

## 2013-02-20 ENCOUNTER — Encounter (HOSPITAL_COMMUNITY): Payer: Self-pay | Admitting: Surgery

## 2013-02-20 NOTE — Progress Notes (Signed)
Quick Note:  Please contact patient and notify of benign pathology results.  Kenyada Dosch M. Saprina Chuong, MD, FACS [facility] Office: [phone]   ______ 

## 2013-02-22 ENCOUNTER — Telehealth (INDEPENDENT_AMBULATORY_CARE_PROVIDER_SITE_OTHER): Payer: Self-pay

## 2013-02-22 NOTE — Telephone Encounter (Signed)
LMOM for pt to call. Per Dr Gala Lewandowsky request pt can be advised of benign path result.

## 2013-02-27 ENCOUNTER — Encounter (INDEPENDENT_AMBULATORY_CARE_PROVIDER_SITE_OTHER): Payer: Self-pay

## 2013-02-27 ENCOUNTER — Ambulatory Visit (INDEPENDENT_AMBULATORY_CARE_PROVIDER_SITE_OTHER): Payer: Medicare Other | Admitting: Surgery

## 2013-02-27 ENCOUNTER — Encounter (INDEPENDENT_AMBULATORY_CARE_PROVIDER_SITE_OTHER): Payer: Self-pay | Admitting: Surgery

## 2013-02-27 VITALS — BP 124/72 | HR 68 | Temp 97.4°F | Resp 16 | Ht 60.0 in | Wt 115.4 lb

## 2013-02-27 DIAGNOSIS — D449 Neoplasm of uncertain behavior of unspecified endocrine gland: Secondary | ICD-10-CM

## 2013-02-27 DIAGNOSIS — D440 Neoplasm of uncertain behavior of thyroid gland: Secondary | ICD-10-CM

## 2013-02-27 NOTE — Patient Instructions (Signed)
  COCOA BUTTER & VITAMIN E CREAM  (Palmer's or other brand)  Apply cocoa butter/vitamin E cream to your incision 2 - 3 times daily.  Massage cream into incision for one minute with each application.  Use sunscreen (50 SPF or higher) for first 6 months after surgery if area is exposed to sun.  You may substitute Mederma or other scar reducing creams as desired.   

## 2013-02-27 NOTE — Progress Notes (Signed)
General Surgery [facility]  Chief Complaint  Patient presents with  . Routine Post Op    right thyroid lobectomy 02/17/2013    HISTORY: Patient is a 80 year old female who underwent right thyroid lobectomy on 02/17/2013. 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 There is a background of chronic lymphocytic thyroiditis. There was no sign of malignancy.  EXAM: Surgical wound is healing nicely. Mild soft tissue swelling. No sign of seroma. Voice quality is normal. Steri-Strips are removed in the office today.  IMPRESSION: Status post right thyroid lobectomy  PLAN: Patient will begin applying topical creams to her incision. We will check her TSH level of the first week of February. She will return for wound check in 6 weeks.  Earnstine Regal, MD, [facility]   Visit Diagnoses: 1. Neoplasm of uncertain behavior of thyroid gland, right lobe

## 2013-04-12 ENCOUNTER — Encounter (INDEPENDENT_AMBULATORY_CARE_PROVIDER_SITE_OTHER): Payer: Self-pay | Admitting: Surgery

## 2013-04-12 ENCOUNTER — Ambulatory Visit (INDEPENDENT_AMBULATORY_CARE_PROVIDER_SITE_OTHER): Payer: Medicare Other | Admitting: Surgery

## 2013-04-12 VITALS — BP 130/76 | HR 80 | Temp 98.3°F | Resp 14 | Ht 60.0 in | Wt 110.0 lb

## 2013-04-12 DIAGNOSIS — D440 Neoplasm of uncertain behavior of thyroid gland: Secondary | ICD-10-CM

## 2013-04-12 DIAGNOSIS — D449 Neoplasm of uncertain behavior of unspecified endocrine gland: Secondary | ICD-10-CM

## 2013-04-12 NOTE — Progress Notes (Signed)
General Surgery [facility]  Chief Complaint  Patient presents with  . Routine Post Op    right thyroid lobectomy 02/17/2013    HISTORY: Patient is an 80 year old female who underwent right thyroid lobectomy in January 2015. Final pathology was benign. She returns today for her second postoperative visit.  In the interim she had laboratory studies obtained in [facility]. This showed a normal TSH of 0.833 and a normal total T4 level of 10.7. She continues on her regular Synthroid dose of 50 mcg daily.  EXAM: Surgical incision is well-healed. No sign of seroma. No sign of infection. Voice quality is normal.  IMPRESSION: Status post right thyroid lobectomy  PLAN: Patient will continue to take Synthroid 50 mcg daily under the direction of her primary care physician. She will continue to apply topical creams to her incision.  Patient will return for surgical care as needed.  Earnstine Regal, MD, [facility]   Visit Diagnoses: 1. Neoplasm of uncertain behavior of thyroid gland, right lobe

## 2013-04-12 NOTE — Patient Instructions (Signed)
  COCOA BUTTER & VITAMIN E CREAM  (Palmer's or other brand)  Apply cocoa butter/vitamin E cream to your incision 2 - 3 times daily.  Massage cream into incision for one minute with each application.  Use sunscreen (50 SPF or higher) for first 6 months after surgery if area is exposed to sun.  You may substitute Mederma or other scar reducing creams as desired.   

## 2013-04-21 ENCOUNTER — Encounter (INDEPENDENT_AMBULATORY_CARE_PROVIDER_SITE_OTHER): Payer: Self-pay

## 2014-08-30 IMAGING — US US CAROTID DUPLEX BILAT
1 series · 13 of 24 positions shown · non-contrast
Comparison: None.

CLINICAL DATA: Carotid stenosis

BILATERAL CAROTID DUPLEX ULTRASOUND
TECHNIQUE: Gray scale imaging, color Doppler and duplex ultrasound
were performed of bilateral carotid and vertebral arteries in the
neck.

[Series 1: us carotid duplex bilat · 0.06mm/px · 13 of 69 slices shown]
[im 1/69]
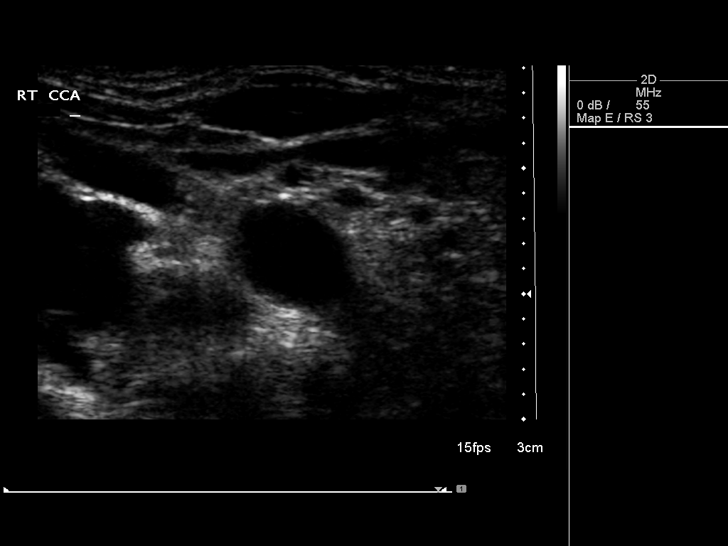
[im 6/69]
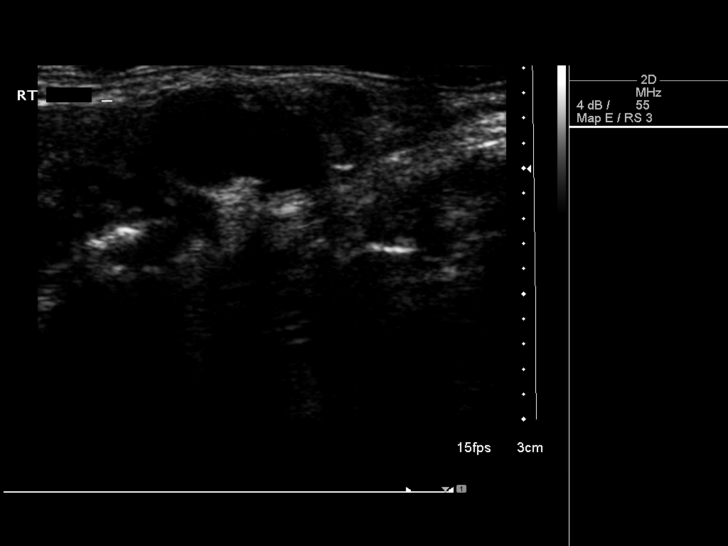
[im 12/69]
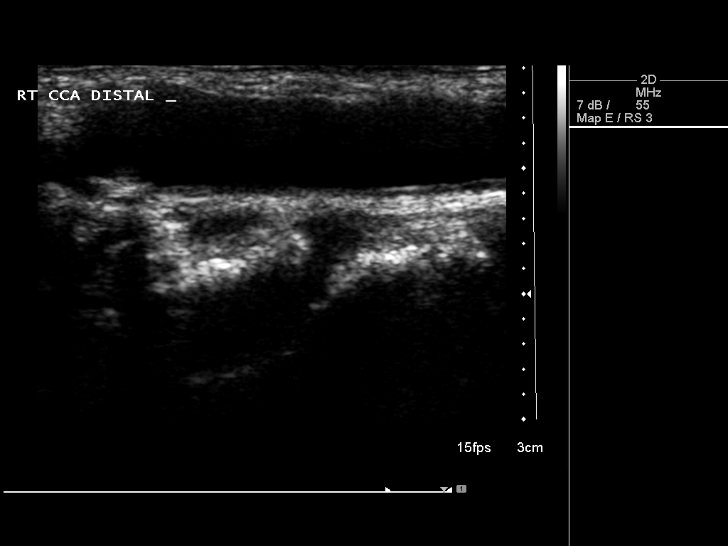
[im 18/69]
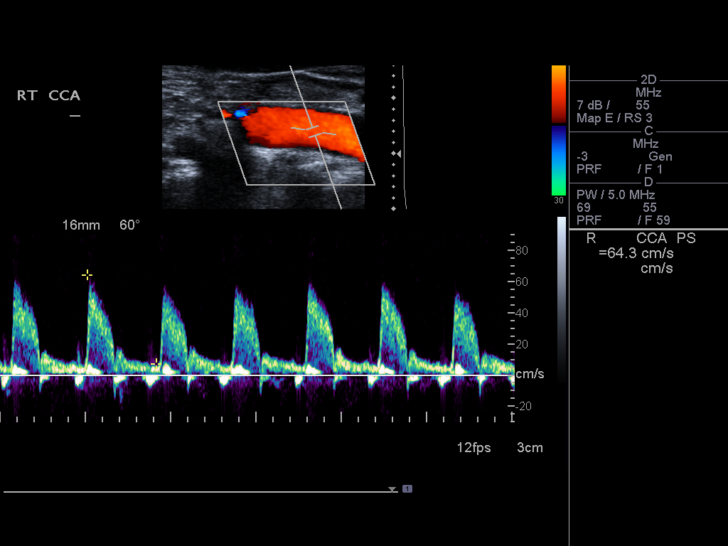
[im 24/69]
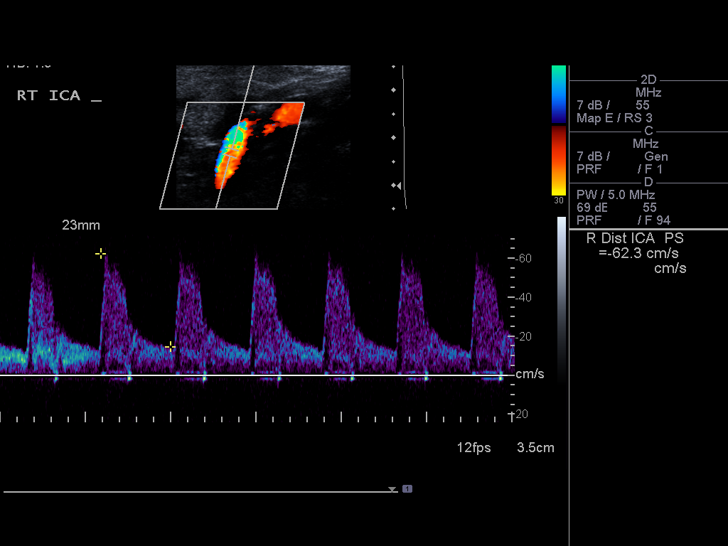
[im 30/69]
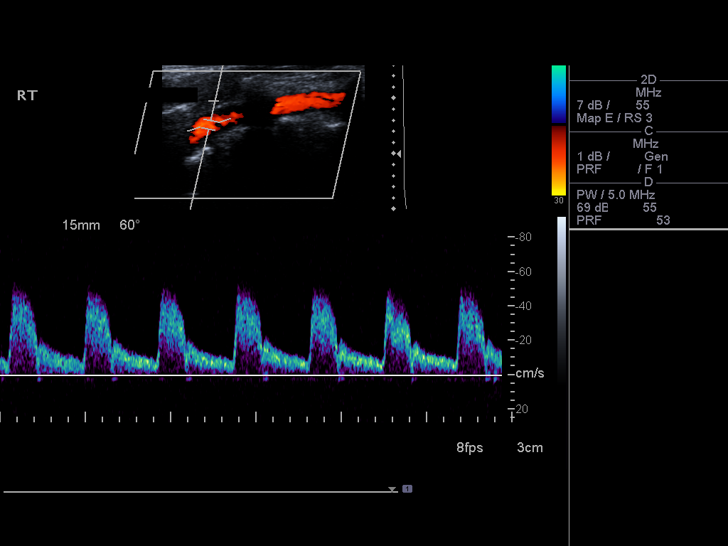
[im 36/69]
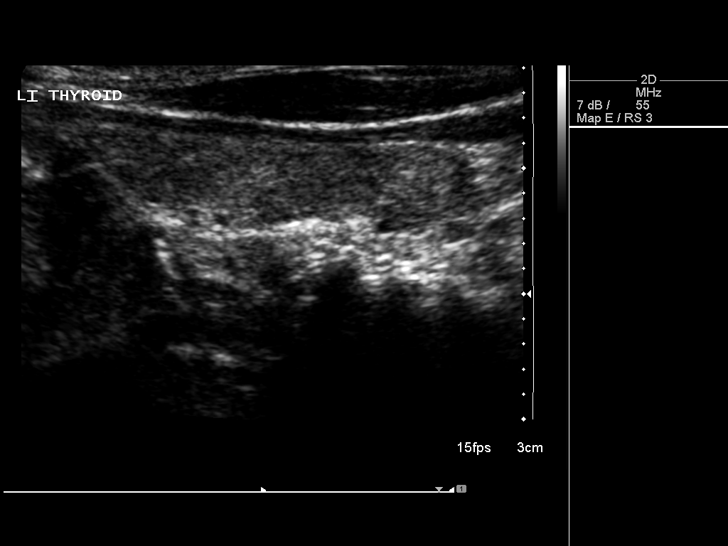
[im 39/69]
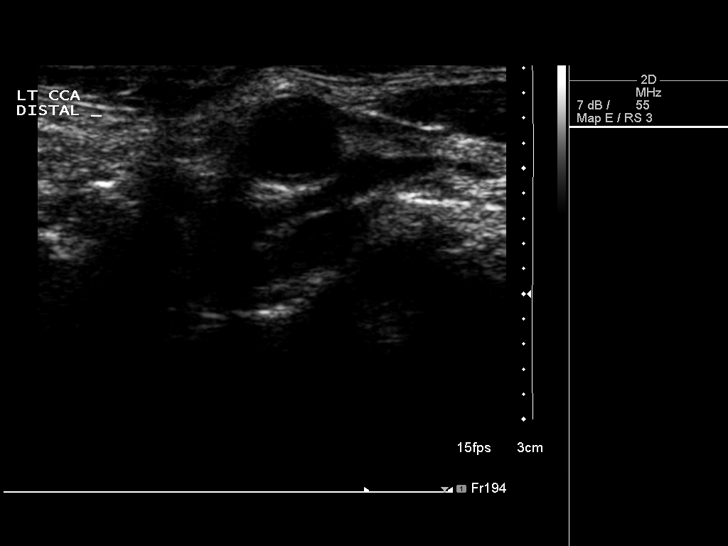
[im 45/69]
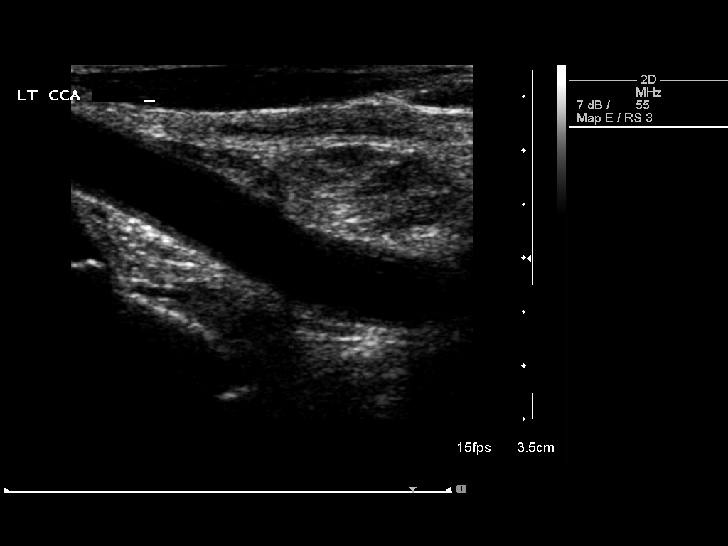
[im 51/69]
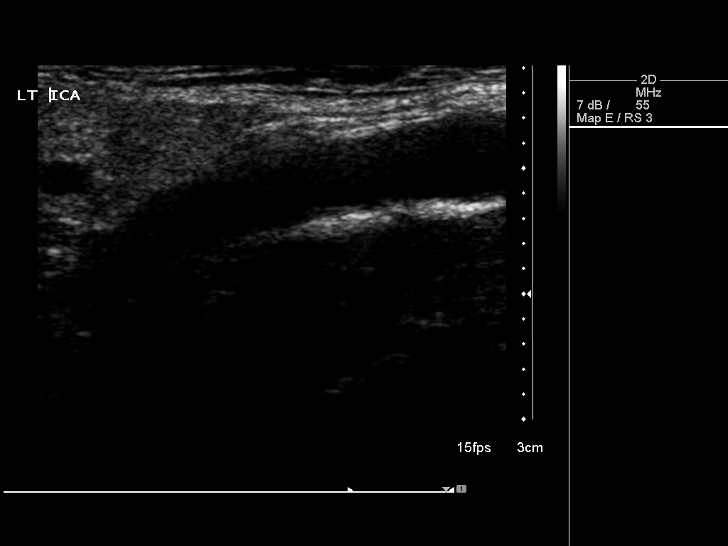
[im 57/69]
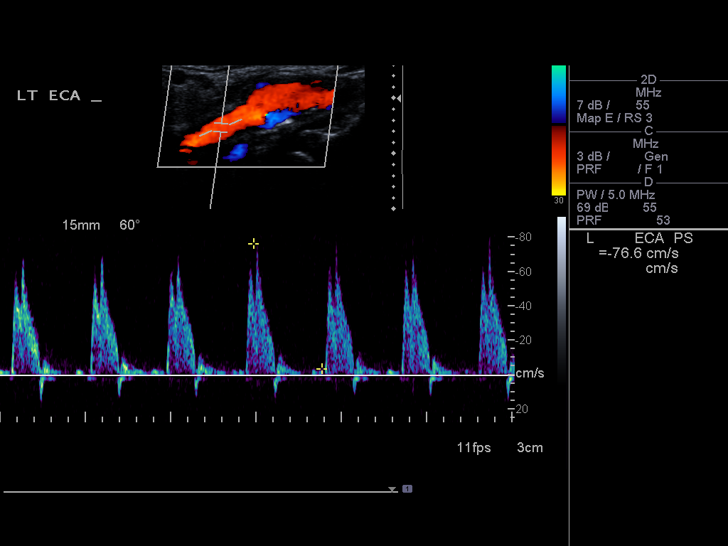
[im 63/69]
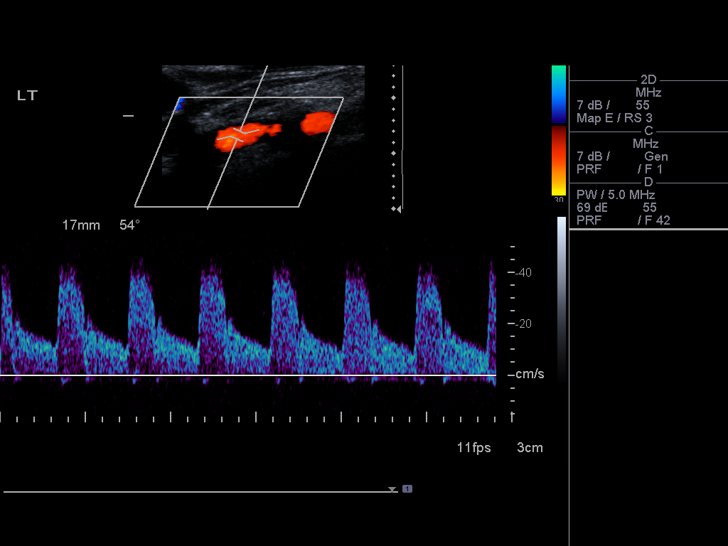
[im 69/69]
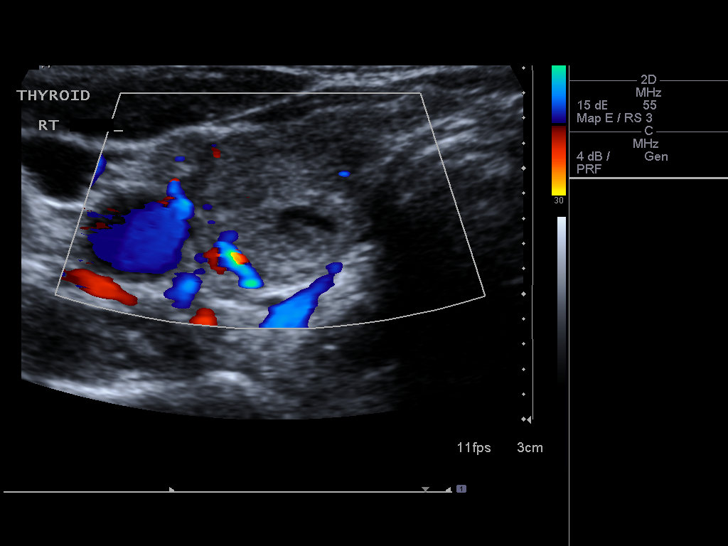

[13 of 24 positions shown; findings below may reference images not displayed]

Criteria:  Quantification of carotid stenosis is based on velocity
parameters that correlate the residual internal carotid diameter
with NASCET-based stenosis levels, using the diameter of the distal
internal carotid lumen as the denominator for stenosis measurement.

The following velocity measurements were obtained:

                 PEAK SYSTOLIC/END DIASTOLIC
RIGHT
ICA:                        118/32cm/sec
CCA:                        67/10cm/sec
SYSTOLIC ICA/CCA RATIO:
DIASTOLIC ICA/CCA RATIO:
ECA:                        55cm/sec

LEFT
ICA:                        72/19cm/sec
CCA:                        71/12cm/sec
SYSTOLIC ICA/CCA RATIO:
DIASTOLIC ICA/CCA RATIO:
ECA:                        77cm/sec
FINDINGS: RIGHT CAROTID ARTERY: Scattered mild plaque is noted.  The
waveforms, velocities and flow velocity ratios demonstrate no
evidence of focal hemodynamically significant stenosis.

RIGHT VERTEBRAL ARTERY:  Antegrade in nature.

LEFT CAROTID ARTERY: Scattered plaque is noted.  The waveforms,
velocities and flow velocity ratios however demonstrate no evidence
of focal hemodynamically significant stenosis.

LEFT VERTEBRAL ARTERY:  Antegrade in nature.

Some heterogeneity to the thyroid gland is seen.  A dominant nodule
is noted on the right.  It measures 1.6 cm.
IMPRESSION: No carotid stenosis is identified.

Right thyroid nodule as described.  Dedicated thyroid imaging is
recommended for further evaluation.

## 2014-12-23 IMAGING — CR DG CHEST 2V
2 series · 2 of 2 positions shown · non-contrast
Comparison: None.

CLINICAL DATA: Preoperative for thyroidectomy

EXAM:
CHEST  2 VIEW

[w chest pa]
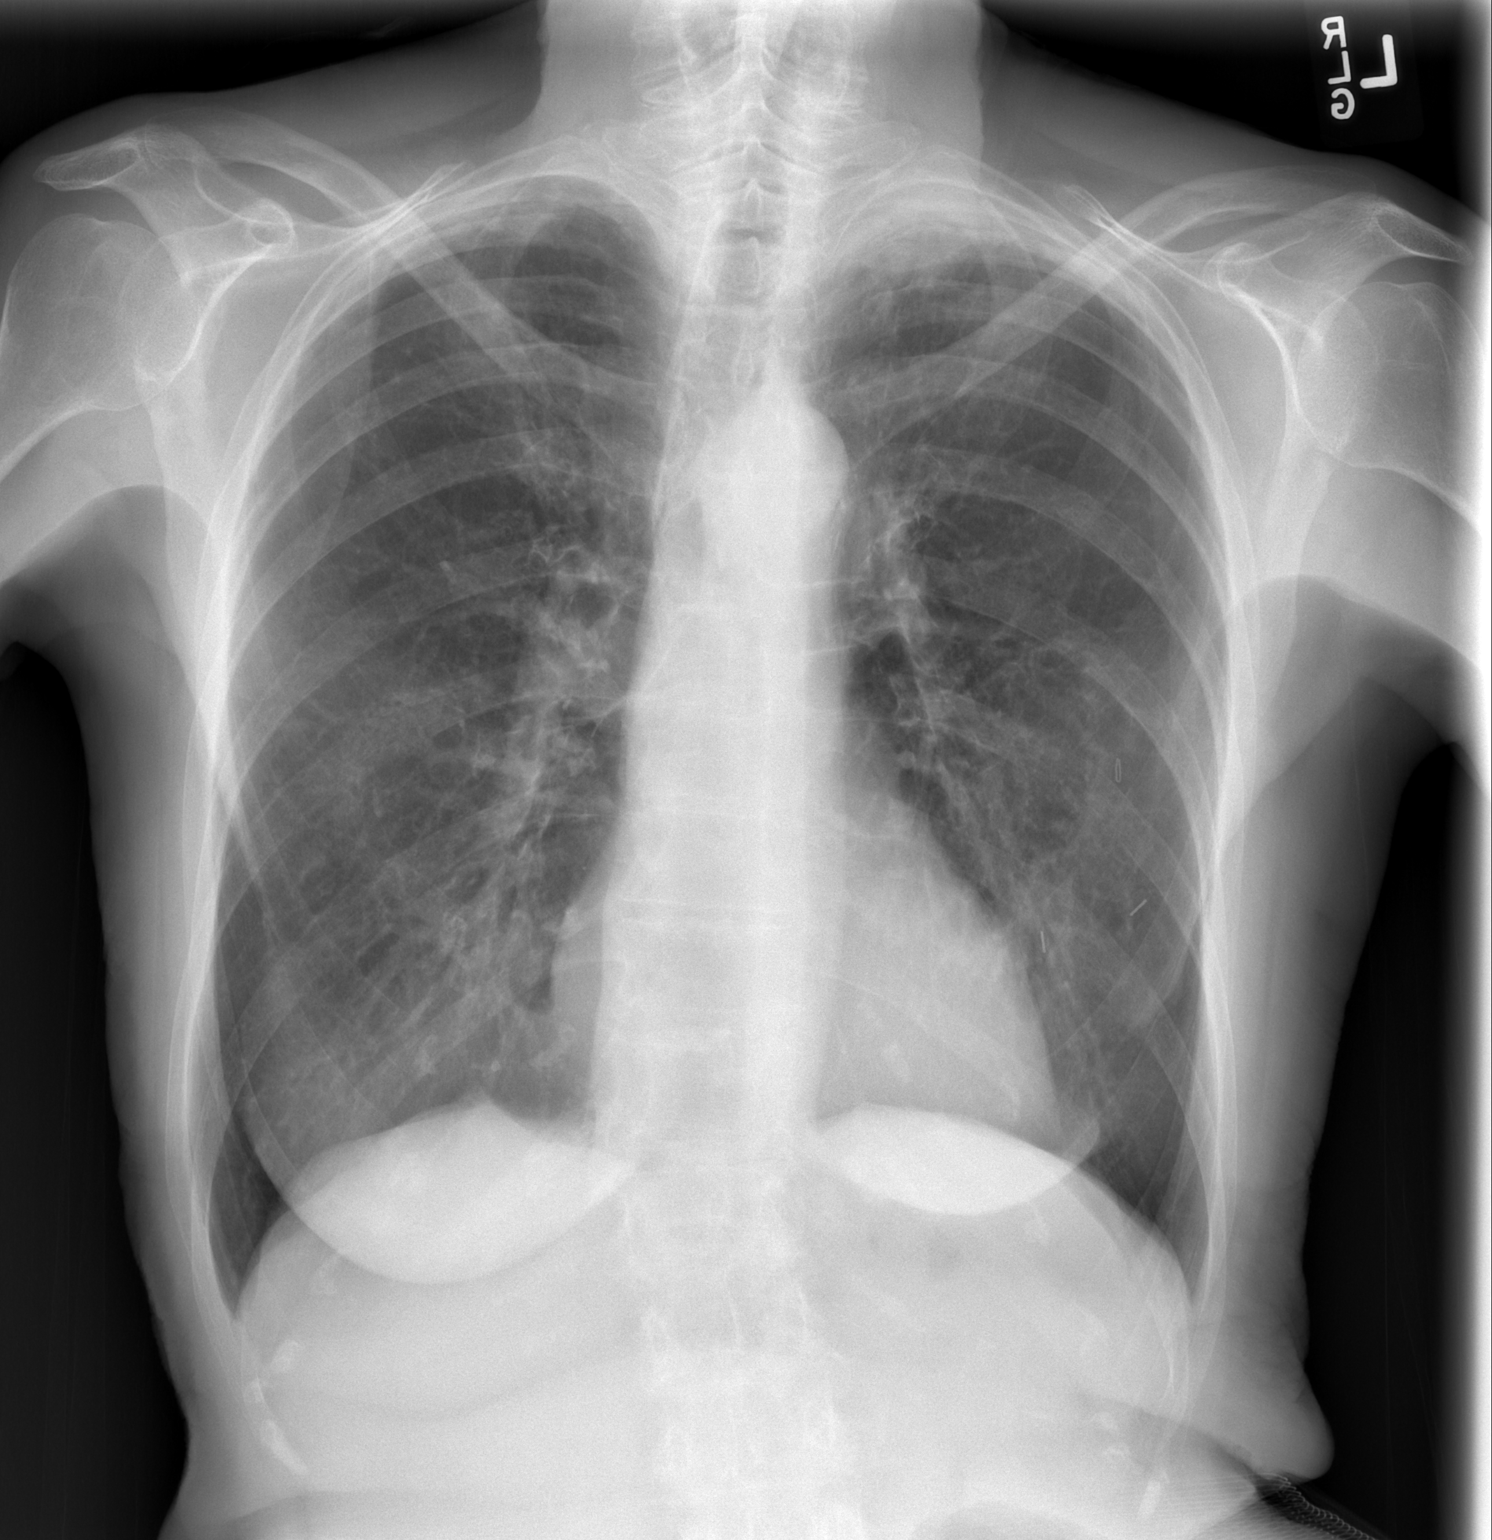

[w chest lat]
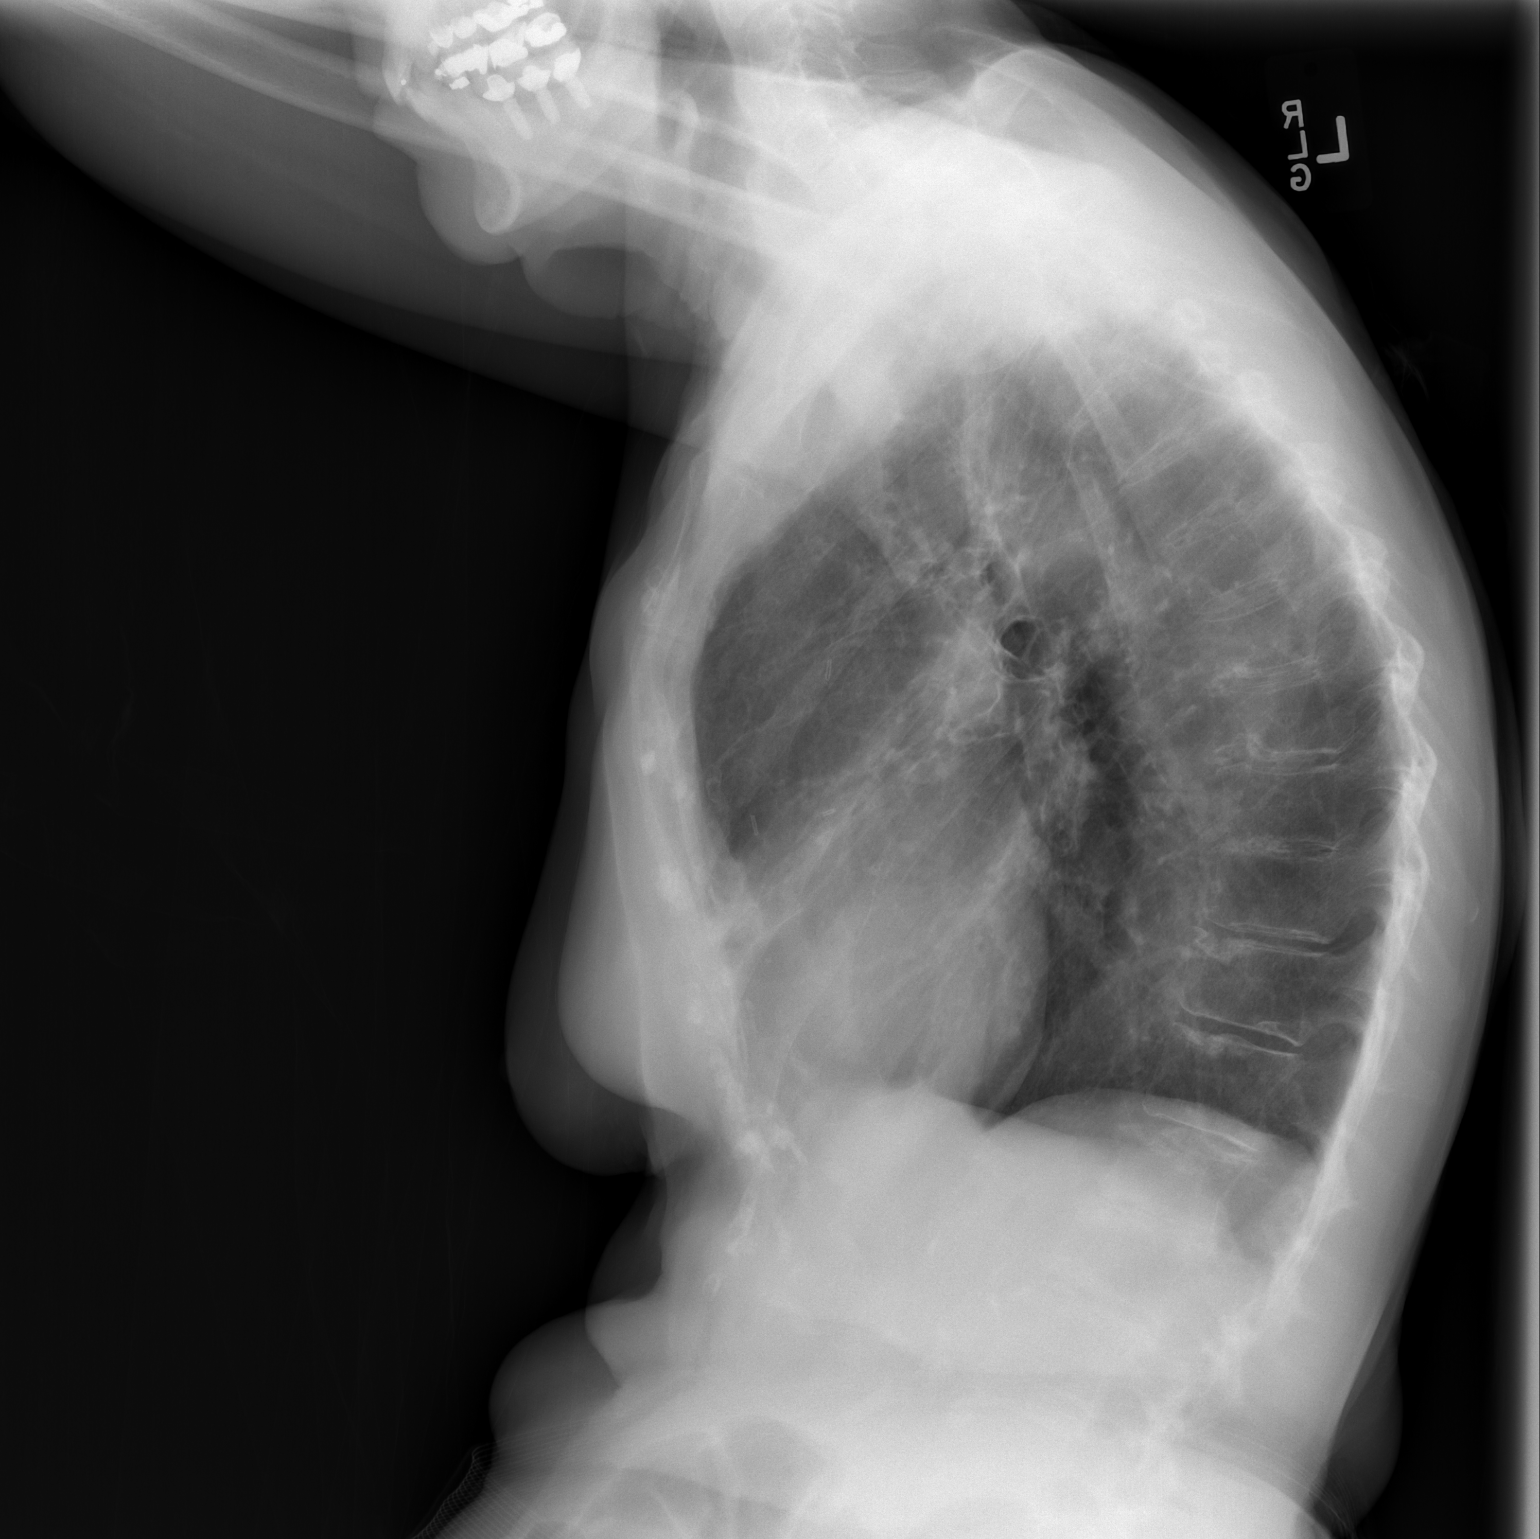

[2 of 2 positions shown; findings below may reference images not displayed]

FINDINGS: The lungs are hyperinflated. There is no focal infiltrate. The
cardiopericardial silhouette is normal in size. The pulmonary
vascularity is not engorged. There surgical clips associated with
the left breast. There is soft tissue density in the left pulmonary
apex that may reflect scarring. The observed portions of the bony
thorax exhibit no acute abnormality. There is mild thoracic
kyphosis.
IMPRESSION: There are findings consistent with COPD. There may be bronchiectasis
inferiorly on the right. There is no evidence of pneumonia. Presumed
apical pleural scarring especially on the left is noted. There is no
evidence of CHF.

## 2021-01-16 DEATH — deceased
